# Patient Record
Sex: Male | Born: 1985 | Race: White | Hispanic: No | Marital: Single | Smoking: Current every day smoker
Health system: Southern US, Community
[De-identification: ages and names within clinical notes are randomized; demographics above are authoritative.]

---

## 2018-08-11 ENCOUNTER — Encounter (HOSPITAL_COMMUNITY): Payer: Self-pay | Admitting: Emergency Medicine

## 2018-08-11 DIAGNOSIS — K0889 Other specified disorders of teeth and supporting structures: Secondary | ICD-10-CM | POA: Insufficient documentation

## 2018-08-11 NOTE — ED Triage Notes (Signed)
Per POV, pt. Came in with complaint of dental pain x 3 days, pt. Stated "this evening pain is unbearable".  Pt. Stated that al his teeth hurt 10/10.

## 2018-08-12 ENCOUNTER — Emergency Department (HOSPITAL_COMMUNITY)
Admission: EM | Admit: 2018-08-12 | Discharge: 2018-08-12 | Disposition: A | Discharge status: Home / Self Care | Payer: Self-pay | Attending: Emergency Medicine | Admitting: Emergency Medicine

## 2018-08-12 DIAGNOSIS — K0889 Other specified disorders of teeth and supporting structures: Secondary | ICD-10-CM

## 2018-08-12 MED ORDER — BENZOCAINE 10 % MT GEL: 1.0000 "application " | OROMUCOSAL | 0 refills | Status: AC | PRN

## 2018-08-12 MED ORDER — PENICILLIN V POTASSIUM 500 MG PO TABS
500.0000 mg | ORAL_TABLET | Freq: Once | ORAL | Status: AC
  Administered 2018-08-12: 500 mg via ORAL
  Filled 2018-08-12: qty 1

## 2018-08-12 MED ORDER — IBUPROFEN 200 MG PO TABS
600.0000 mg | ORAL_TABLET | Freq: Once | ORAL | Status: AC
  Administered 2018-08-12: 600 mg via ORAL
  Filled 2018-08-12: qty 3

## 2018-08-12 MED ORDER — PENICILLIN V POTASSIUM 500 MG PO TABS: 500.0000 mg | ORAL_TABLET | Freq: Four times a day (QID) | ORAL | 0 refills | Status: AC

## 2018-08-12 NOTE — Discharge Instructions (Signed)
Take antibiotics as directed to help with dental pain which is likely due to infection around broken teeth.  Use benzocaine gel on Q-tip as we discussed to help with pain as well as Motrin and Tylenol.  You will need follow-up with a dentist for further evaluation.  Return for fevers, facial swelling, significantly worsened pain, difficulty breathing or swallowing or any other new or concerning symptoms.

## 2018-08-12 NOTE — ED Provider Notes (Signed)
[facility] Provider Note   CSN: [id number] Arrival date & time: 08/11/18  2214     History   Chief Complaint Chief Complaint  Patient presents with  . Dental Pain    HPI Jamie Rios is a 32 y.o. male.  Jamie Rios is a 32 y.o. Male who is otherwise healthy, presents to the emergency department for evaluation of lower dental pain.  He reports he has had pain over lower back teeth for the past 3 days that has been constant and worsening since onset.  He reports that throbbing and the pain has become unbearable.  He has not taken anything to try and treat this pain prior to arrival.  He denies any fevers or chills, no nausea or vomiting, chewing is painful but no difficulty swallowing or breathing, no swelling or pain under the tongue.  He has not tried follow-up with a dentist regarding this pain, does note that he has multiple broken teeth.     History reviewed. No pertinent past medical history.  There are no active problems to display for this patient.   History reviewed. No pertinent surgical history.      Home Medications    Prior to Admission medications   Not on File    Family History History reviewed. No pertinent family history.  Social History Social History   Tobacco Use  . Smoking status: Not on file  Substance Use Topics  . Alcohol use: Yes  . Drug use: Yes    Types: Cocaine     Allergies   Patient has no allergy information on record.   Review of Systems Review of Systems  Constitutional: Negative for chills and fever.  HENT: Positive for dental problem. Negative for facial swelling, sore throat and trouble swallowing.   Respiratory: Negative for shortness of breath and stridor.   Skin: Negative for color change and rash.  All other systems reviewed and are negative.    Physical Exam Updated Vital Signs BP 131/78   Pulse 74   Temp 99.4 F (37.4 C)   Resp 18   SpO2 100%   Physical Exam    Constitutional: He appears well-developed and well-nourished. No distress.  HENT:  Head: Normocephalic and atraumatic.  Possible teeth broken off at the base of the root and poor dentition, there is swelling around lower gums bilaterally around broken teeth but no obvious drainable abscess, no sublingual swelling or tenderness, no trismus, no torticollis, tolerating secretions without difficulty, normal phonation.  Eyes: Right eye exhibits no discharge. Left eye exhibits no discharge.  Neck: Normal range of motion. Neck supple.  No swelling or tenderness, no stridor  Pulmonary/Chest: Effort normal. No respiratory distress.  Musculoskeletal: He exhibits no deformity.  Neurological: He is alert. Coordination normal.  Skin: Skin is warm and dry. He is not diaphoretic.  Psychiatric: He has a normal mood and affect. His behavior is normal.  Nursing note and vitals reviewed.    ED Treatments / Results  Labs (all labs ordered are listed, but only abnormal results are displayed) Labs Reviewed - No data to display  EKG None  Radiology No results found.  Procedures Procedures (including critical care time)  Medications Ordered in ED Medications - No data to display   Initial Impression / Assessment and Plan / ED Course  I have reviewed the triage vital signs and the nursing notes.  Pertinent labs & imaging results that were available during my care of the patient were reviewed by  me and considered in my medical decision making (see chart for details).  Patient with toothache.  No gross abscess.  Exam unconcerning for Ludwig's angina or spread of infection.  Will treat with penicillin and anti-inflammatories medicine.  Urged patient to follow-up with dentist.  Dental resources provided.  Final Clinical Impressions(s) / ED Diagnoses   Final diagnoses:  Tooth pain    ED Discharge Orders    None       Dartha Lodge, [place] 08/12/18 0351    Geoffery Lyons, MD 08/12/18  [phone]

## 2019-02-23 ENCOUNTER — Emergency Department (HOSPITAL_BASED_OUTPATIENT_CLINIC_OR_DEPARTMENT_OTHER)
Admission: EM | Admit: 2019-02-23 | Discharge: 2019-02-23 | Discharge status: Against Medical Advice | Payer: Self-pay | Attending: Emergency Medicine | Admitting: Emergency Medicine

## 2019-02-23 ENCOUNTER — Emergency Department (HOSPITAL_BASED_OUTPATIENT_CLINIC_OR_DEPARTMENT_OTHER): Payer: Self-pay

## 2019-02-23 ENCOUNTER — Encounter (HOSPITAL_BASED_OUTPATIENT_CLINIC_OR_DEPARTMENT_OTHER): Payer: Self-pay | Admitting: *Deleted

## 2019-02-23 ENCOUNTER — Other Ambulatory Visit: Payer: Self-pay

## 2019-02-23 DIAGNOSIS — F17200 Nicotine dependence, unspecified, uncomplicated: Secondary | ICD-10-CM | POA: Insufficient documentation

## 2019-02-23 DIAGNOSIS — Z5329 Procedure and treatment not carried out because of patient's decision for other reasons: Secondary | ICD-10-CM | POA: Insufficient documentation

## 2019-02-23 DIAGNOSIS — J069 Acute upper respiratory infection, unspecified: Secondary | ICD-10-CM | POA: Insufficient documentation

## 2019-02-23 MED ORDER — ALBUTEROL SULFATE HFA 108 (90 BASE) MCG/ACT IN AERS: 2.0000 | INHALATION_SPRAY | Freq: Once | RESPIRATORY_TRACT | Status: DC

## 2019-02-23 NOTE — ED Notes (Signed)
Reports cough, subjective fever, body aches, chills x 1 week.  Has not taken any medication for symptoms

## 2019-02-23 NOTE — ED Provider Notes (Signed)
[facility] EMERGENCY DEPARTMENT Provider Note   CSN: [id number] Arrival date & time: 02/23/19  1137    History   Chief Complaint Chief Complaint  Patient presents with  . Fever    HPI Jamie Rios is a 33 y.o. male.     HPI   33 year old male with no past medical history presenting emergency department today for evaluation of dry cough, sweats, chills, subjective fevers, body aches for 1 week.  States he has had some shortness of breath as well.  No rhinorrhea, nasal congestion or sore throat.  No chest pain.  Is only having shortness of breath when he is doing activities.  Shortness of breath at rest.  No pleuritic chest pain.  Has tried no interventions for his symptoms.  No lower extremity swelling, lower extremity pain, hemoptysis, recent surgeries, admissions or extended periods of travel.  No foreign travel.  No known COVID exposures.  History reviewed. No pertinent past medical history.  There are no active problems to display for this patient.   History reviewed. No pertinent surgical history.      Home Medications    Prior to Admission medications   Medication Sig Start Date End Date Taking? Authorizing Provider  benzocaine (ORAJEL) 10 % mucosal gel Use as directed 1 application in the mouth or throat as needed for mouth pain. 08/12/18   Dartha Lodge, PA-C    Family History No family history on file.  Social History Social History   Tobacco Use  . Smoking status: Current Every Day Smoker  . Smokeless tobacco: Never Used  Substance Use Topics  . Alcohol use: Yes  . Drug use: Yes    Types: Cocaine     Allergies   Patient has no known allergies.   Review of Systems Review of Systems  Constitutional: Positive for chills, diaphoresis and fever.  HENT: Negative for congestion, ear pain, rhinorrhea and sore throat.   Eyes: Negative for visual disturbance.  Respiratory: Positive for cough and shortness of breath.   Cardiovascular:  Negative for chest pain and leg swelling.  Gastrointestinal: Negative for abdominal pain, constipation, diarrhea, nausea and vomiting.  Genitourinary: Negative for dysuria and hematuria.  Musculoskeletal: Negative for back pain.  Skin: Negative for rash.  Neurological: Negative for headaches.  All other systems reviewed and are negative.   Physical Exam Updated Vital Signs BP 131/80 (BP Location: Right Arm)   Pulse 86   Temp 98.6 F (37 C) (Oral)   Resp 18   Ht 5\' 6"  (1.676 m)   Wt 74.8 kg   SpO2 100%   BMI 26.63 kg/m   Physical Exam Vitals signs (Visual examination only given COVID pandemic protocol.) and nursing note reviewed.  Constitutional:      General: He is not in acute distress.    Appearance: He is well-developed. He is not ill-appearing or toxic-appearing.  Eyes:     Conjunctiva/sclera: Conjunctivae normal.  Cardiovascular:     Rate and Rhythm: Normal rate.  Pulmonary:     Effort: Pulmonary effort is normal.     Comments: Patient is able to speak in full sentences without tachypnea.  Does not appear dyspneic. Musculoskeletal: Normal range of motion.     Comments: Ambulatory in room  Skin:    Coloration: Skin is not pale.  Neurological:     Mental Status: He is alert and oriented to person, place, and time.  Psychiatric:        Mood and Affect: Mood normal.  ED Treatments / Results  Labs (all labs ordered are listed, but only abnormal results are displayed) Labs Reviewed - No data to display  EKG None  Radiology Dg Chest Portable 1 View  Result Date: 02/23/2019 CLINICAL DATA:  Cough.  Body aches. EXAM: PORTABLE CHEST 1 VIEW COMPARISON:  None. FINDINGS: The heart size and mediastinal contours are within normal limits. Both lungs are clear. The visualized skeletal structures are unremarkable. IMPRESSION: Normal exam. Electronically Signed   By: Francene Boyers M.D.   On: 02/23/2019 12:59    Procedures Procedures (including critical care time)   Medications Ordered in ED Medications  albuterol (PROVENTIL HFA;VENTOLIN HFA) 108 (90 Base) MCG/ACT inhaler 2 puff (2 puffs Inhalation Refused 02/23/19 1351)     Initial Impression / Assessment and Plan / ED Course  I have reviewed the triage vital signs and the nursing notes.  Pertinent labs & imaging results that were available during my care of the patient were reviewed by me and considered in my medical decision making (see chart for details).     Final Clinical Impressions(s) / ED Diagnoses   Final diagnoses:  Upper respiratory tract infection, unspecified type   Pt presents for evaluation of URI sxs that have been present for the last week. Visual examination only given COVID pandemic protocol.  Patient without increased work of breathing.  Afebrile, vital signs normal.  Satting 100% on room air with normal respirations.  Nontoxic nonseptic appearing.  Chest x-ray obtained and does not show any evidence of pneumonia.  I do suspect viral cause.  Discussed deferment of influenza testing given patient symptoms started 1 week ago and he is out of the window for Tamiflu.  Discussed COVID testing protocol and that he does not meet testing criteria as he is not being admitted to the hospital.  Discussed symptomatic management and will give albuterol inhaler for shortness of breath and cough.  Advise close monitoring of symptoms.  Advise close follow-up with PCP.  Advised on specific return precautions.  Advised on quarantine instructions.  Patient voices understanding the plan and reasons to return.  All questions answered.  Patient stable for discharge.  Jamie Rios was evaluated in Emergency Department on 02/23/2019 for the symptoms described in the history of present illness. He was evaluated in the context of the global COVID-19 pandemic, which necessitated consideration that the patient might be at risk for infection with the SARS-CoV-2 virus that causes COVID-19. Institutional protocols and  algorithms that pertain to the evaluation of patients at risk for COVID-19 are in a state of rapid change based on information released by regulatory bodies including the CDC and federal and state organizations. These policies and algorithms were followed during the patient's care in the ED.     ED Discharge Orders    None       Rayne Du 02/23/19 1355    Azalia Bilis, MD 02/23/19 1527

## 2019-02-23 NOTE — ED Triage Notes (Signed)
Cough, sob, body aches for a week. No Tylenol today. Chills.

## 2019-02-23 NOTE — Discharge Instructions (Addendum)
Please take 2 puffs of the albuterol inhaler as needed for cough and shortness of breath   Control Measures  Patients who have symptoms consistent with COVID-19 should self-isolate for:  -At least 3 days (72 hours) have passed since recovery defined as resolution of fever without the use of fever-reducing medications and improvement in respiratory symptoms (e.g., cough, shortness of breath)  AND  -At least 7 days have passed since symptoms first appeared.  Close contacts of a person with known or suspected COVID-19 should self-monitor their temperature and symptoms of COVID-19, limit outside interaction as much as possible for 14 days, and self-isolate if they develop symptoms.   Please return to the emergency department for any new or worsening symptoms.

## 2019-12-13 IMAGING — DX PORTABLE CHEST - 1 VIEW
1 series · 1 of 1 positions shown · non-contrast
Comparison: None.

CLINICAL DATA: Cough.  Body aches.

EXAM:
PORTABLE CHEST 1 VIEW

[chest ap]
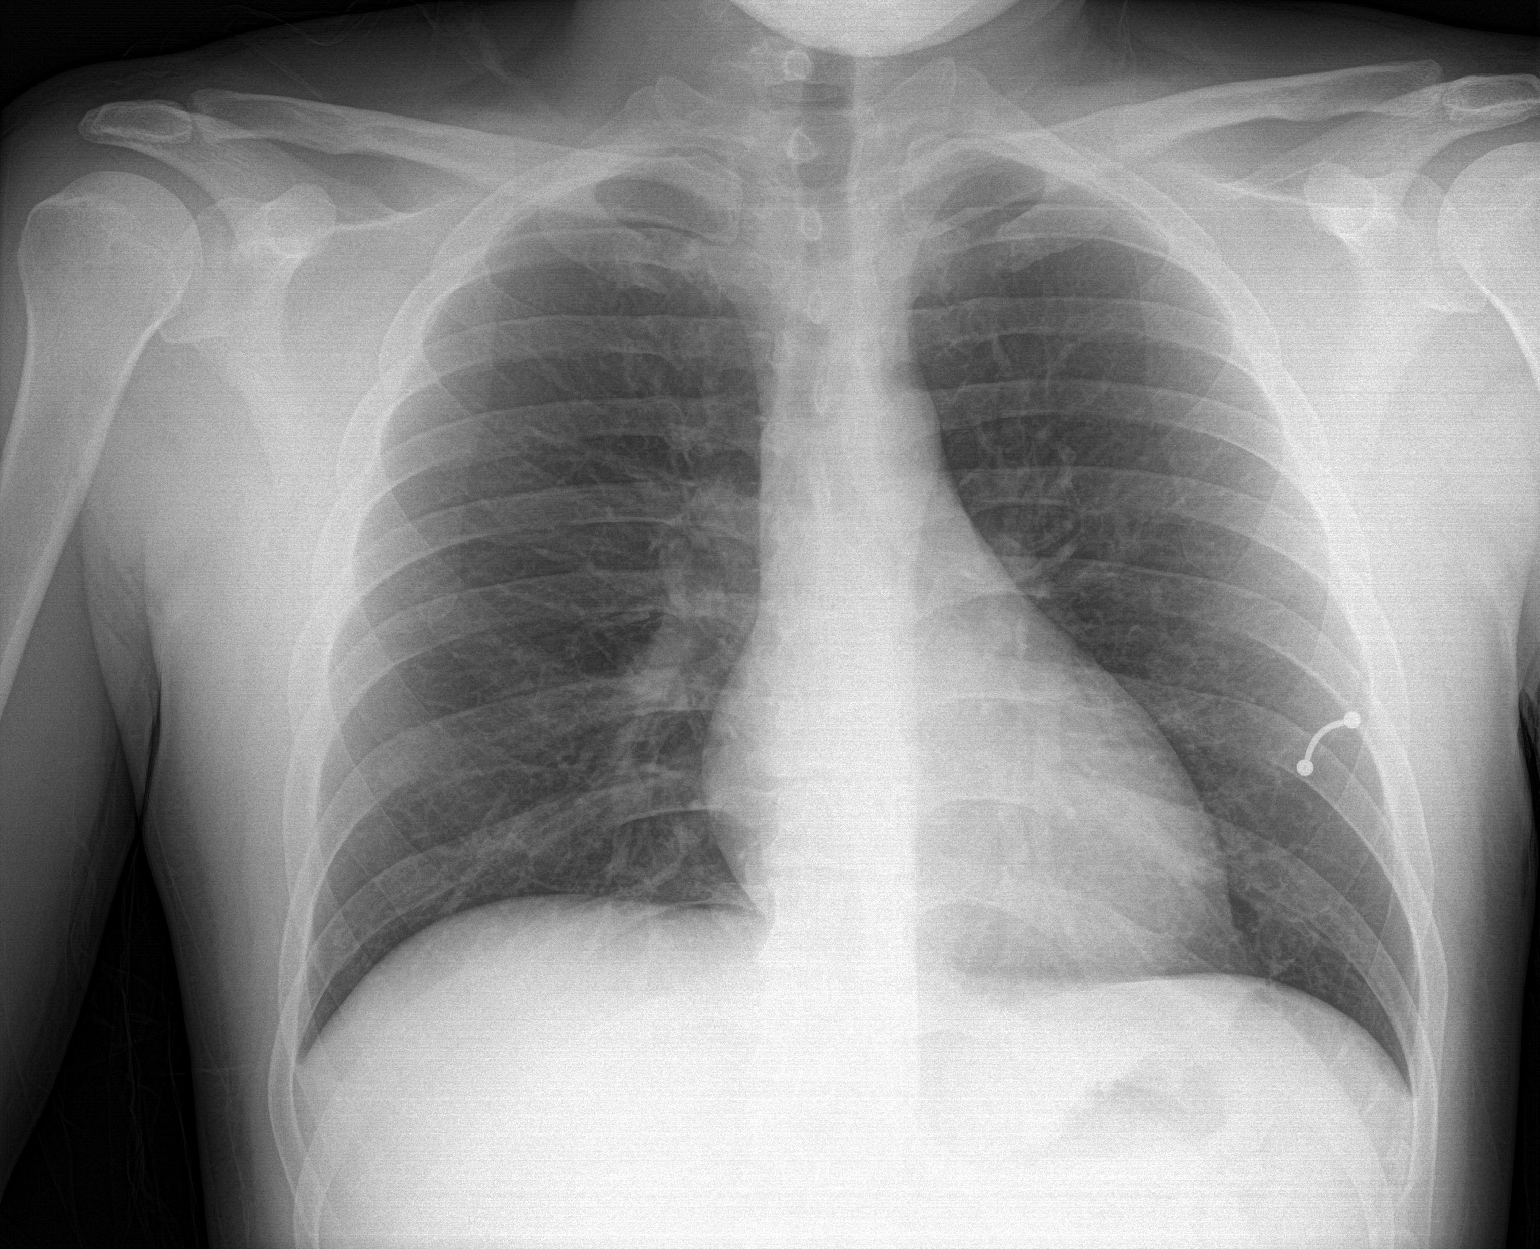

[1 of 1 positions shown; findings below may reference images not displayed]

FINDINGS: The heart size and mediastinal contours are within normal limits.
Both lungs are clear. The visualized skeletal structures are
unremarkable.
IMPRESSION: Normal exam.

## 2021-07-23 DEATH — deceased
